# Patient Record
Sex: Female | Born: 1945 | Hispanic: No | Smoking: Never smoker
Health system: Southern US, Community
[De-identification: ages and names within clinical notes are randomized; demographics above are authoritative.]

## PROBLEM LIST (undated history)

## (undated) DIAGNOSIS — M1990 Unspecified osteoarthritis, unspecified site: Secondary | ICD-10-CM

## (undated) DIAGNOSIS — K449 Diaphragmatic hernia without obstruction or gangrene: Secondary | ICD-10-CM

## (undated) DIAGNOSIS — L309 Dermatitis, unspecified: Secondary | ICD-10-CM

## (undated) DIAGNOSIS — K219 Gastro-esophageal reflux disease without esophagitis: Secondary | ICD-10-CM

## (undated) HISTORY — DX: Diaphragmatic hernia without obstruction or gangrene: K44.9

## (undated) HISTORY — DX: Dermatitis, unspecified: L30.9

## (undated) HISTORY — DX: Unspecified osteoarthritis, unspecified site: M19.90

## (undated) HISTORY — DX: Gastro-esophageal reflux disease without esophagitis: K21.9

---

## 2000-02-25 ENCOUNTER — Other Ambulatory Visit: Admission: RE | Admit: 2000-02-25 | Discharge: 2000-02-25 | Payer: Self-pay | Admitting: Obstetrics and Gynecology

## 2001-03-05 ENCOUNTER — Other Ambulatory Visit: Admission: RE | Admit: 2001-03-05 | Discharge: 2001-03-05 | Payer: Self-pay | Admitting: Obstetrics and Gynecology

## 2002-03-14 ENCOUNTER — Other Ambulatory Visit: Admission: RE | Admit: 2002-03-14 | Discharge: 2002-03-14 | Payer: Self-pay | Admitting: Obstetrics and Gynecology

## 2003-03-17 ENCOUNTER — Other Ambulatory Visit: Admission: RE | Admit: 2003-03-17 | Discharge: 2003-03-17 | Payer: Self-pay | Admitting: Obstetrics and Gynecology

## 2011-10-26 DIAGNOSIS — H00039 Abscess of eyelid unspecified eye, unspecified eyelid: Secondary | ICD-10-CM | POA: Diagnosis not present

## 2011-11-02 DIAGNOSIS — Z1231 Encounter for screening mammogram for malignant neoplasm of breast: Secondary | ICD-10-CM | POA: Diagnosis not present

## 2011-11-02 DIAGNOSIS — M949 Disorder of cartilage, unspecified: Secondary | ICD-10-CM | POA: Diagnosis not present

## 2011-11-02 DIAGNOSIS — M899 Disorder of bone, unspecified: Secondary | ICD-10-CM | POA: Diagnosis not present

## 2011-11-17 DIAGNOSIS — H1044 Vernal conjunctivitis: Secondary | ICD-10-CM | POA: Diagnosis not present

## 2011-11-22 DIAGNOSIS — D649 Anemia, unspecified: Secondary | ICD-10-CM | POA: Diagnosis not present

## 2011-11-22 DIAGNOSIS — E785 Hyperlipidemia, unspecified: Secondary | ICD-10-CM | POA: Diagnosis not present

## 2011-11-22 DIAGNOSIS — Z0000 Encounter for general adult medical examination without abnormal findings: Secondary | ICD-10-CM | POA: Diagnosis not present

## 2011-11-22 DIAGNOSIS — Z23 Encounter for immunization: Secondary | ICD-10-CM | POA: Diagnosis not present

## 2011-11-22 DIAGNOSIS — E559 Vitamin D deficiency, unspecified: Secondary | ICD-10-CM | POA: Diagnosis not present

## 2011-11-22 DIAGNOSIS — R7301 Impaired fasting glucose: Secondary | ICD-10-CM | POA: Diagnosis not present

## 2011-11-22 DIAGNOSIS — M899 Disorder of bone, unspecified: Secondary | ICD-10-CM | POA: Diagnosis not present

## 2011-11-24 DIAGNOSIS — T7840XA Allergy, unspecified, initial encounter: Secondary | ICD-10-CM | POA: Diagnosis not present

## 2011-12-15 DIAGNOSIS — M899 Disorder of bone, unspecified: Secondary | ICD-10-CM | POA: Diagnosis not present

## 2011-12-15 DIAGNOSIS — E785 Hyperlipidemia, unspecified: Secondary | ICD-10-CM | POA: Diagnosis not present

## 2011-12-15 DIAGNOSIS — E559 Vitamin D deficiency, unspecified: Secondary | ICD-10-CM | POA: Diagnosis not present

## 2011-12-15 DIAGNOSIS — R7301 Impaired fasting glucose: Secondary | ICD-10-CM | POA: Diagnosis not present

## 2011-12-15 DIAGNOSIS — D649 Anemia, unspecified: Secondary | ICD-10-CM | POA: Diagnosis not present

## 2011-12-15 DIAGNOSIS — Z0000 Encounter for general adult medical examination without abnormal findings: Secondary | ICD-10-CM | POA: Diagnosis not present

## 2012-06-04 DIAGNOSIS — L2089 Other atopic dermatitis: Secondary | ICD-10-CM | POA: Diagnosis not present

## 2012-06-04 DIAGNOSIS — L821 Other seborrheic keratosis: Secondary | ICD-10-CM | POA: Diagnosis not present

## 2012-08-29 DIAGNOSIS — D2360 Other benign neoplasm of skin of unspecified upper limb, including shoulder: Secondary | ICD-10-CM | POA: Diagnosis not present

## 2012-08-29 DIAGNOSIS — L821 Other seborrheic keratosis: Secondary | ICD-10-CM | POA: Diagnosis not present

## 2012-08-29 DIAGNOSIS — D2370 Other benign neoplasm of skin of unspecified lower limb, including hip: Secondary | ICD-10-CM | POA: Diagnosis not present

## 2012-08-29 DIAGNOSIS — L819 Disorder of pigmentation, unspecified: Secondary | ICD-10-CM | POA: Diagnosis not present

## 2012-08-29 DIAGNOSIS — L2089 Other atopic dermatitis: Secondary | ICD-10-CM | POA: Diagnosis not present

## 2012-08-29 DIAGNOSIS — D485 Neoplasm of uncertain behavior of skin: Secondary | ICD-10-CM | POA: Diagnosis not present

## 2012-08-29 DIAGNOSIS — D239 Other benign neoplasm of skin, unspecified: Secondary | ICD-10-CM | POA: Diagnosis not present

## 2012-11-04 DIAGNOSIS — Z1231 Encounter for screening mammogram for malignant neoplasm of breast: Secondary | ICD-10-CM | POA: Diagnosis not present

## 2013-02-17 DIAGNOSIS — H109 Unspecified conjunctivitis: Secondary | ICD-10-CM | POA: Diagnosis not present

## 2013-03-04 DIAGNOSIS — H109 Unspecified conjunctivitis: Secondary | ICD-10-CM | POA: Diagnosis not present

## 2013-11-27 DIAGNOSIS — Z01419 Encounter for gynecological examination (general) (routine) without abnormal findings: Secondary | ICD-10-CM | POA: Diagnosis not present

## 2013-11-27 DIAGNOSIS — Z124 Encounter for screening for malignant neoplasm of cervix: Secondary | ICD-10-CM | POA: Diagnosis not present

## 2013-12-11 DIAGNOSIS — M899 Disorder of bone, unspecified: Secondary | ICD-10-CM | POA: Diagnosis not present

## 2013-12-11 DIAGNOSIS — M949 Disorder of cartilage, unspecified: Secondary | ICD-10-CM | POA: Diagnosis not present

## 2013-12-11 DIAGNOSIS — Z1231 Encounter for screening mammogram for malignant neoplasm of breast: Secondary | ICD-10-CM | POA: Diagnosis not present

## 2014-01-21 DIAGNOSIS — M25569 Pain in unspecified knee: Secondary | ICD-10-CM | POA: Diagnosis not present

## 2014-03-18 DIAGNOSIS — H1044 Vernal conjunctivitis: Secondary | ICD-10-CM | POA: Diagnosis not present

## 2014-04-03 DIAGNOSIS — H02409 Unspecified ptosis of unspecified eyelid: Secondary | ICD-10-CM | POA: Diagnosis not present

## 2014-04-23 DIAGNOSIS — M8580 Other specified disorders of bone density and structure, unspecified site: Secondary | ICD-10-CM | POA: Diagnosis not present

## 2014-04-23 DIAGNOSIS — E785 Hyperlipidemia, unspecified: Secondary | ICD-10-CM | POA: Diagnosis not present

## 2014-04-27 DIAGNOSIS — Z0000 Encounter for general adult medical examination without abnormal findings: Secondary | ICD-10-CM | POA: Diagnosis not present

## 2014-04-27 DIAGNOSIS — R319 Hematuria, unspecified: Secondary | ICD-10-CM | POA: Diagnosis not present

## 2014-04-27 DIAGNOSIS — R1032 Left lower quadrant pain: Secondary | ICD-10-CM | POA: Diagnosis not present

## 2014-04-27 DIAGNOSIS — M8580 Other specified disorders of bone density and structure, unspecified site: Secondary | ICD-10-CM | POA: Diagnosis not present

## 2014-04-27 DIAGNOSIS — E559 Vitamin D deficiency, unspecified: Secondary | ICD-10-CM | POA: Diagnosis not present

## 2014-04-27 DIAGNOSIS — E785 Hyperlipidemia, unspecified: Secondary | ICD-10-CM | POA: Diagnosis not present

## 2014-04-27 DIAGNOSIS — Z862 Personal history of diseases of the blood and blood-forming organs and certain disorders involving the immune mechanism: Secondary | ICD-10-CM | POA: Diagnosis not present

## 2014-07-15 DIAGNOSIS — L309 Dermatitis, unspecified: Secondary | ICD-10-CM | POA: Diagnosis not present

## 2014-07-15 DIAGNOSIS — R143 Flatulence: Secondary | ICD-10-CM | POA: Diagnosis not present

## 2015-01-13 DIAGNOSIS — Z1231 Encounter for screening mammogram for malignant neoplasm of breast: Secondary | ICD-10-CM | POA: Diagnosis not present

## 2015-05-18 DIAGNOSIS — L438 Other lichen planus: Secondary | ICD-10-CM | POA: Diagnosis not present

## 2015-05-18 DIAGNOSIS — L280 Lichen simplex chronicus: Secondary | ICD-10-CM | POA: Diagnosis not present

## 2015-05-18 DIAGNOSIS — L218 Other seborrheic dermatitis: Secondary | ICD-10-CM | POA: Diagnosis not present

## 2015-05-18 DIAGNOSIS — L570 Actinic keratosis: Secondary | ICD-10-CM | POA: Diagnosis not present

## 2015-05-18 DIAGNOSIS — L821 Other seborrheic keratosis: Secondary | ICD-10-CM | POA: Diagnosis not present

## 2015-07-15 DIAGNOSIS — D1801 Hemangioma of skin and subcutaneous tissue: Secondary | ICD-10-CM | POA: Diagnosis not present

## 2015-07-15 DIAGNOSIS — D225 Melanocytic nevi of trunk: Secondary | ICD-10-CM | POA: Diagnosis not present

## 2015-07-15 DIAGNOSIS — L821 Other seborrheic keratosis: Secondary | ICD-10-CM | POA: Diagnosis not present

## 2015-07-15 DIAGNOSIS — L218 Other seborrheic dermatitis: Secondary | ICD-10-CM | POA: Diagnosis not present

## 2015-07-15 DIAGNOSIS — D2372 Other benign neoplasm of skin of left lower limb, including hip: Secondary | ICD-10-CM | POA: Diagnosis not present

## 2015-07-15 DIAGNOSIS — L280 Lichen simplex chronicus: Secondary | ICD-10-CM | POA: Diagnosis not present

## 2015-10-29 DIAGNOSIS — H2513 Age-related nuclear cataract, bilateral: Secondary | ICD-10-CM | POA: Diagnosis not present

## 2016-02-07 DIAGNOSIS — Z1231 Encounter for screening mammogram for malignant neoplasm of breast: Secondary | ICD-10-CM | POA: Diagnosis not present

## 2016-02-07 DIAGNOSIS — M8589 Other specified disorders of bone density and structure, multiple sites: Secondary | ICD-10-CM | POA: Diagnosis not present

## 2016-02-08 DIAGNOSIS — Z124 Encounter for screening for malignant neoplasm of cervix: Secondary | ICD-10-CM | POA: Diagnosis not present

## 2016-03-16 DIAGNOSIS — Z136 Encounter for screening for cardiovascular disorders: Secondary | ICD-10-CM | POA: Diagnosis not present

## 2016-03-16 DIAGNOSIS — Z1159 Encounter for screening for other viral diseases: Secondary | ICD-10-CM | POA: Diagnosis not present

## 2016-03-16 DIAGNOSIS — Z131 Encounter for screening for diabetes mellitus: Secondary | ICD-10-CM | POA: Diagnosis not present

## 2016-03-16 DIAGNOSIS — Z0000 Encounter for general adult medical examination without abnormal findings: Secondary | ICD-10-CM | POA: Diagnosis not present

## 2016-03-21 DIAGNOSIS — Z0000 Encounter for general adult medical examination without abnormal findings: Secondary | ICD-10-CM | POA: Diagnosis not present

## 2016-03-21 DIAGNOSIS — M8580 Other specified disorders of bone density and structure, unspecified site: Secondary | ICD-10-CM | POA: Diagnosis not present

## 2016-03-21 DIAGNOSIS — M25562 Pain in left knee: Secondary | ICD-10-CM | POA: Diagnosis not present

## 2016-03-21 DIAGNOSIS — E559 Vitamin D deficiency, unspecified: Secondary | ICD-10-CM | POA: Diagnosis not present

## 2016-03-21 DIAGNOSIS — M25561 Pain in right knee: Secondary | ICD-10-CM | POA: Diagnosis not present

## 2016-03-21 DIAGNOSIS — Z1159 Encounter for screening for other viral diseases: Secondary | ICD-10-CM | POA: Diagnosis not present

## 2016-03-21 DIAGNOSIS — E785 Hyperlipidemia, unspecified: Secondary | ICD-10-CM | POA: Diagnosis not present

## 2016-03-23 DIAGNOSIS — Z1211 Encounter for screening for malignant neoplasm of colon: Secondary | ICD-10-CM | POA: Diagnosis not present

## 2016-05-22 DIAGNOSIS — H43811 Vitreous degeneration, right eye: Secondary | ICD-10-CM | POA: Diagnosis not present

## 2016-06-05 DIAGNOSIS — H43811 Vitreous degeneration, right eye: Secondary | ICD-10-CM | POA: Diagnosis not present

## 2016-07-05 DIAGNOSIS — E785 Hyperlipidemia, unspecified: Secondary | ICD-10-CM | POA: Diagnosis not present

## 2016-07-24 HISTORY — PX: BELPHAROPTOSIS REPAIR: SHX369

## 2016-07-24 HISTORY — PX: CATARACT EXTRACTION, BILATERAL: SHX1313

## 2016-08-02 DIAGNOSIS — L280 Lichen simplex chronicus: Secondary | ICD-10-CM | POA: Diagnosis not present

## 2016-08-02 DIAGNOSIS — D225 Melanocytic nevi of trunk: Secondary | ICD-10-CM | POA: Diagnosis not present

## 2016-08-02 DIAGNOSIS — L2089 Other atopic dermatitis: Secondary | ICD-10-CM | POA: Diagnosis not present

## 2016-08-02 DIAGNOSIS — L218 Other seborrheic dermatitis: Secondary | ICD-10-CM | POA: Diagnosis not present

## 2016-08-02 DIAGNOSIS — D1801 Hemangioma of skin and subcutaneous tissue: Secondary | ICD-10-CM | POA: Diagnosis not present

## 2016-08-02 DIAGNOSIS — L821 Other seborrheic keratosis: Secondary | ICD-10-CM | POA: Diagnosis not present

## 2016-11-28 DIAGNOSIS — H02831 Dermatochalasis of right upper eyelid: Secondary | ICD-10-CM | POA: Diagnosis not present

## 2016-11-28 DIAGNOSIS — H02834 Dermatochalasis of left upper eyelid: Secondary | ICD-10-CM | POA: Diagnosis not present

## 2017-02-07 DIAGNOSIS — Z1231 Encounter for screening mammogram for malignant neoplasm of breast: Secondary | ICD-10-CM | POA: Diagnosis not present

## 2017-02-25 DIAGNOSIS — B029 Zoster without complications: Secondary | ICD-10-CM | POA: Diagnosis not present

## 2017-03-14 DIAGNOSIS — Z124 Encounter for screening for malignant neoplasm of cervix: Secondary | ICD-10-CM | POA: Diagnosis not present

## 2017-03-14 DIAGNOSIS — Z8049 Family history of malignant neoplasm of other genital organs: Secondary | ICD-10-CM | POA: Diagnosis not present

## 2017-03-27 DIAGNOSIS — E785 Hyperlipidemia, unspecified: Secondary | ICD-10-CM | POA: Diagnosis not present

## 2017-03-29 ENCOUNTER — Other Ambulatory Visit: Payer: Self-pay | Admitting: Family Medicine

## 2017-03-29 DIAGNOSIS — Z862 Personal history of diseases of the blood and blood-forming organs and certain disorders involving the immune mechanism: Secondary | ICD-10-CM | POA: Diagnosis not present

## 2017-03-29 DIAGNOSIS — M8580 Other specified disorders of bone density and structure, unspecified site: Secondary | ICD-10-CM | POA: Diagnosis not present

## 2017-03-29 DIAGNOSIS — Z1389 Encounter for screening for other disorder: Secondary | ICD-10-CM | POA: Diagnosis not present

## 2017-03-29 DIAGNOSIS — R1310 Dysphagia, unspecified: Secondary | ICD-10-CM

## 2017-03-29 DIAGNOSIS — Z6825 Body mass index (BMI) 25.0-25.9, adult: Secondary | ICD-10-CM | POA: Diagnosis not present

## 2017-03-29 DIAGNOSIS — Z0000 Encounter for general adult medical examination without abnormal findings: Secondary | ICD-10-CM | POA: Diagnosis not present

## 2017-03-29 DIAGNOSIS — R5383 Other fatigue: Secondary | ICD-10-CM | POA: Diagnosis not present

## 2017-03-29 DIAGNOSIS — E785 Hyperlipidemia, unspecified: Secondary | ICD-10-CM | POA: Diagnosis not present

## 2017-03-29 DIAGNOSIS — E559 Vitamin D deficiency, unspecified: Secondary | ICD-10-CM | POA: Diagnosis not present

## 2017-03-29 DIAGNOSIS — E663 Overweight: Secondary | ICD-10-CM | POA: Diagnosis not present

## 2017-04-04 ENCOUNTER — Ambulatory Visit
Admission: RE | Admit: 2017-04-04 | Discharge: 2017-04-04 | Disposition: A | Discharge status: Home / Self Care | Payer: Medicare Other | Source: Ambulatory Visit | Attending: Family Medicine | Admitting: Family Medicine

## 2017-04-04 ENCOUNTER — Other Ambulatory Visit: Payer: Self-pay

## 2017-04-04 DIAGNOSIS — R1310 Dysphagia, unspecified: Secondary | ICD-10-CM

## 2017-04-04 DIAGNOSIS — K449 Diaphragmatic hernia without obstruction or gangrene: Secondary | ICD-10-CM | POA: Diagnosis not present

## 2017-04-18 DIAGNOSIS — H02413 Mechanical ptosis of bilateral eyelids: Secondary | ICD-10-CM | POA: Diagnosis not present

## 2017-05-23 DIAGNOSIS — H02413 Mechanical ptosis of bilateral eyelids: Secondary | ICD-10-CM | POA: Diagnosis not present

## 2017-07-04 DIAGNOSIS — R1310 Dysphagia, unspecified: Secondary | ICD-10-CM | POA: Diagnosis not present

## 2017-07-04 DIAGNOSIS — Z23 Encounter for immunization: Secondary | ICD-10-CM | POA: Diagnosis not present

## 2017-07-04 DIAGNOSIS — E663 Overweight: Secondary | ICD-10-CM | POA: Diagnosis not present

## 2017-07-04 DIAGNOSIS — E785 Hyperlipidemia, unspecified: Secondary | ICD-10-CM | POA: Diagnosis not present

## 2017-08-07 DIAGNOSIS — D225 Melanocytic nevi of trunk: Secondary | ICD-10-CM | POA: Diagnosis not present

## 2017-08-07 DIAGNOSIS — L280 Lichen simplex chronicus: Secondary | ICD-10-CM | POA: Diagnosis not present

## 2017-08-07 DIAGNOSIS — D2372 Other benign neoplasm of skin of left lower limb, including hip: Secondary | ICD-10-CM | POA: Diagnosis not present

## 2017-08-07 DIAGNOSIS — D224 Melanocytic nevi of scalp and neck: Secondary | ICD-10-CM | POA: Diagnosis not present

## 2017-08-07 DIAGNOSIS — L720 Epidermal cyst: Secondary | ICD-10-CM | POA: Diagnosis not present

## 2017-08-07 DIAGNOSIS — L218 Other seborrheic dermatitis: Secondary | ICD-10-CM | POA: Diagnosis not present

## 2017-08-07 DIAGNOSIS — L918 Other hypertrophic disorders of the skin: Secondary | ICD-10-CM | POA: Diagnosis not present

## 2017-08-07 DIAGNOSIS — L821 Other seborrheic keratosis: Secondary | ICD-10-CM | POA: Diagnosis not present

## 2017-09-28 DIAGNOSIS — J111 Influenza due to unidentified influenza virus with other respiratory manifestations: Secondary | ICD-10-CM | POA: Diagnosis not present

## 2018-01-11 DIAGNOSIS — H2513 Age-related nuclear cataract, bilateral: Secondary | ICD-10-CM | POA: Diagnosis not present

## 2018-02-11 DIAGNOSIS — Z1231 Encounter for screening mammogram for malignant neoplasm of breast: Secondary | ICD-10-CM | POA: Diagnosis not present

## 2018-02-11 DIAGNOSIS — M8589 Other specified disorders of bone density and structure, multiple sites: Secondary | ICD-10-CM | POA: Diagnosis not present

## 2018-03-13 DIAGNOSIS — H2513 Age-related nuclear cataract, bilateral: Secondary | ICD-10-CM | POA: Diagnosis not present

## 2018-03-18 DIAGNOSIS — Z124 Encounter for screening for malignant neoplasm of cervix: Secondary | ICD-10-CM | POA: Diagnosis not present

## 2018-03-24 DIAGNOSIS — M791 Myalgia, unspecified site: Secondary | ICD-10-CM | POA: Diagnosis not present

## 2018-03-24 DIAGNOSIS — R509 Fever, unspecified: Secondary | ICD-10-CM | POA: Diagnosis not present

## 2018-04-01 DIAGNOSIS — D259 Leiomyoma of uterus, unspecified: Secondary | ICD-10-CM | POA: Diagnosis not present

## 2018-04-01 DIAGNOSIS — Z8049 Family history of malignant neoplasm of other genital organs: Secondary | ICD-10-CM | POA: Diagnosis not present

## 2018-04-03 DIAGNOSIS — G5701 Lesion of sciatic nerve, right lower limb: Secondary | ICD-10-CM | POA: Diagnosis not present

## 2018-04-03 DIAGNOSIS — Z6825 Body mass index (BMI) 25.0-25.9, adult: Secondary | ICD-10-CM | POA: Diagnosis not present

## 2018-04-04 DIAGNOSIS — H2513 Age-related nuclear cataract, bilateral: Secondary | ICD-10-CM | POA: Diagnosis not present

## 2018-04-12 DIAGNOSIS — Z23 Encounter for immunization: Secondary | ICD-10-CM | POA: Diagnosis not present

## 2018-04-12 DIAGNOSIS — Z79899 Other long term (current) drug therapy: Secondary | ICD-10-CM | POA: Diagnosis not present

## 2018-04-12 DIAGNOSIS — Z6825 Body mass index (BMI) 25.0-25.9, adult: Secondary | ICD-10-CM | POA: Diagnosis not present

## 2018-04-12 DIAGNOSIS — Z1389 Encounter for screening for other disorder: Secondary | ICD-10-CM | POA: Diagnosis not present

## 2018-04-12 DIAGNOSIS — E785 Hyperlipidemia, unspecified: Secondary | ICD-10-CM | POA: Diagnosis not present

## 2018-04-12 DIAGNOSIS — M25562 Pain in left knee: Secondary | ICD-10-CM | POA: Diagnosis not present

## 2018-04-12 DIAGNOSIS — Z0000 Encounter for general adult medical examination without abnormal findings: Secondary | ICD-10-CM | POA: Diagnosis not present

## 2018-04-12 DIAGNOSIS — M25561 Pain in right knee: Secondary | ICD-10-CM | POA: Diagnosis not present

## 2018-04-25 DIAGNOSIS — H524 Presbyopia: Secondary | ICD-10-CM | POA: Diagnosis not present

## 2018-04-25 DIAGNOSIS — H25013 Cortical age-related cataract, bilateral: Secondary | ICD-10-CM | POA: Diagnosis not present

## 2018-04-25 DIAGNOSIS — H2513 Age-related nuclear cataract, bilateral: Secondary | ICD-10-CM | POA: Diagnosis not present

## 2018-05-21 DIAGNOSIS — H25811 Combined forms of age-related cataract, right eye: Secondary | ICD-10-CM | POA: Diagnosis not present

## 2018-05-21 DIAGNOSIS — H25011 Cortical age-related cataract, right eye: Secondary | ICD-10-CM | POA: Diagnosis not present

## 2018-05-21 DIAGNOSIS — H2511 Age-related nuclear cataract, right eye: Secondary | ICD-10-CM | POA: Diagnosis not present

## 2018-06-11 DIAGNOSIS — H25812 Combined forms of age-related cataract, left eye: Secondary | ICD-10-CM | POA: Diagnosis not present

## 2018-06-11 DIAGNOSIS — H25012 Cortical age-related cataract, left eye: Secondary | ICD-10-CM | POA: Diagnosis not present

## 2018-06-11 DIAGNOSIS — H2512 Age-related nuclear cataract, left eye: Secondary | ICD-10-CM | POA: Diagnosis not present

## 2018-08-13 DIAGNOSIS — L821 Other seborrheic keratosis: Secondary | ICD-10-CM | POA: Diagnosis not present

## 2018-08-13 DIAGNOSIS — L280 Lichen simplex chronicus: Secondary | ICD-10-CM | POA: Diagnosis not present

## 2018-08-13 DIAGNOSIS — D1801 Hemangioma of skin and subcutaneous tissue: Secondary | ICD-10-CM | POA: Diagnosis not present

## 2018-08-13 DIAGNOSIS — L853 Xerosis cutis: Secondary | ICD-10-CM | POA: Diagnosis not present

## 2018-08-26 DIAGNOSIS — H1013 Acute atopic conjunctivitis, bilateral: Secondary | ICD-10-CM | POA: Diagnosis not present

## 2019-01-18 DIAGNOSIS — B029 Zoster without complications: Secondary | ICD-10-CM | POA: Diagnosis not present

## 2019-02-17 DIAGNOSIS — Z1231 Encounter for screening mammogram for malignant neoplasm of breast: Secondary | ICD-10-CM | POA: Diagnosis not present

## 2019-03-26 DIAGNOSIS — E785 Hyperlipidemia, unspecified: Secondary | ICD-10-CM | POA: Diagnosis not present

## 2019-03-26 DIAGNOSIS — M8580 Other specified disorders of bone density and structure, unspecified site: Secondary | ICD-10-CM | POA: Diagnosis not present

## 2019-04-29 DIAGNOSIS — Z23 Encounter for immunization: Secondary | ICD-10-CM | POA: Diagnosis not present

## 2019-04-29 DIAGNOSIS — Z6826 Body mass index (BMI) 26.0-26.9, adult: Secondary | ICD-10-CM | POA: Diagnosis not present

## 2019-04-29 DIAGNOSIS — H6123 Impacted cerumen, bilateral: Secondary | ICD-10-CM | POA: Diagnosis not present

## 2019-04-29 DIAGNOSIS — E559 Vitamin D deficiency, unspecified: Secondary | ICD-10-CM | POA: Diagnosis not present

## 2019-04-29 DIAGNOSIS — J309 Allergic rhinitis, unspecified: Secondary | ICD-10-CM | POA: Diagnosis not present

## 2019-04-29 DIAGNOSIS — E785 Hyperlipidemia, unspecified: Secondary | ICD-10-CM | POA: Diagnosis not present

## 2019-04-29 DIAGNOSIS — M859 Disorder of bone density and structure, unspecified: Secondary | ICD-10-CM | POA: Diagnosis not present

## 2019-04-29 DIAGNOSIS — Z79899 Other long term (current) drug therapy: Secondary | ICD-10-CM | POA: Diagnosis not present

## 2019-04-29 DIAGNOSIS — Z0000 Encounter for general adult medical examination without abnormal findings: Secondary | ICD-10-CM | POA: Diagnosis not present

## 2019-04-29 DIAGNOSIS — E663 Overweight: Secondary | ICD-10-CM | POA: Diagnosis not present

## 2019-05-15 DIAGNOSIS — Z124 Encounter for screening for malignant neoplasm of cervix: Secondary | ICD-10-CM | POA: Diagnosis not present

## 2019-06-02 DIAGNOSIS — E785 Hyperlipidemia, unspecified: Secondary | ICD-10-CM | POA: Diagnosis not present

## 2019-06-02 DIAGNOSIS — M8580 Other specified disorders of bone density and structure, unspecified site: Secondary | ICD-10-CM | POA: Diagnosis not present

## 2019-08-15 DIAGNOSIS — M8580 Other specified disorders of bone density and structure, unspecified site: Secondary | ICD-10-CM | POA: Diagnosis not present

## 2019-08-15 DIAGNOSIS — E785 Hyperlipidemia, unspecified: Secondary | ICD-10-CM | POA: Diagnosis not present

## 2019-08-19 DIAGNOSIS — D1801 Hemangioma of skin and subcutaneous tissue: Secondary | ICD-10-CM | POA: Diagnosis not present

## 2019-08-19 DIAGNOSIS — L821 Other seborrheic keratosis: Secondary | ICD-10-CM | POA: Diagnosis not present

## 2019-08-19 DIAGNOSIS — D225 Melanocytic nevi of trunk: Secondary | ICD-10-CM | POA: Diagnosis not present

## 2019-08-19 DIAGNOSIS — L918 Other hypertrophic disorders of the skin: Secondary | ICD-10-CM | POA: Diagnosis not present

## 2019-08-19 DIAGNOSIS — L309 Dermatitis, unspecified: Secondary | ICD-10-CM | POA: Diagnosis not present

## 2019-08-19 DIAGNOSIS — D2372 Other benign neoplasm of skin of left lower limb, including hip: Secondary | ICD-10-CM | POA: Diagnosis not present

## 2019-08-19 DIAGNOSIS — L603 Nail dystrophy: Secondary | ICD-10-CM | POA: Diagnosis not present

## 2019-08-26 IMAGING — RF DG UGI W/ HIGH DENSITY W/KUB
7 series · 14 of 24 positions shown · non-contrast
Comparison: None.

CLINICAL DATA: Dysphasia.

EXAM:
UPPER GI SERIES WITH KUB
TECHNIQUE: After obtaining a scout radiograph a routine upper GI series was
performed using thin and high density barium.
FLUOROSCOPY TIME:  Fluoroscopy Time:  1 minutes and 54 seconds
Radiation Exposure Index (if provided by the fluoroscopic device):
82 mGy
Number of Acquired Spot Images: 0

[Series 1: one shot · 1 of 1 slices shown (1 of 2)]
[im 1/1]
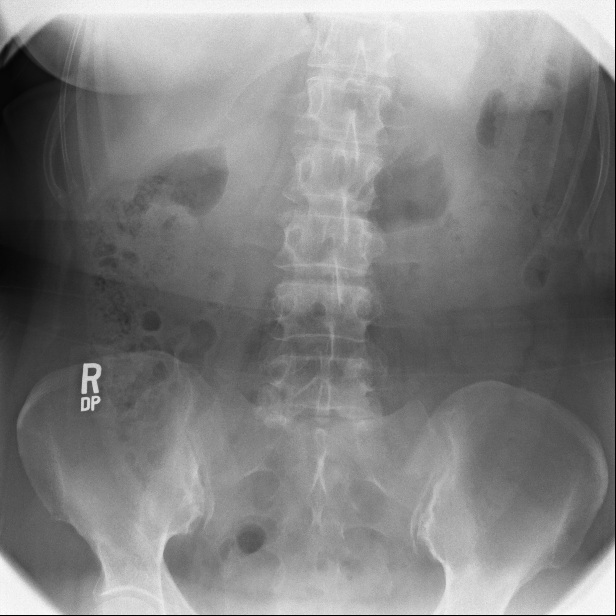

[Series 2: sequence · 1 of 11 frames shown (1 of 5)]
[frame 6/11]
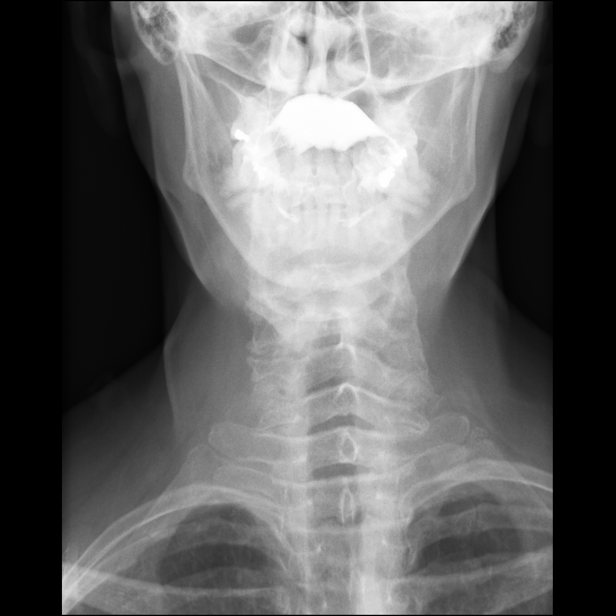

[Series 3: sequence · 3 of 9 frames shown (2 of 5)]
[frame 2/9]
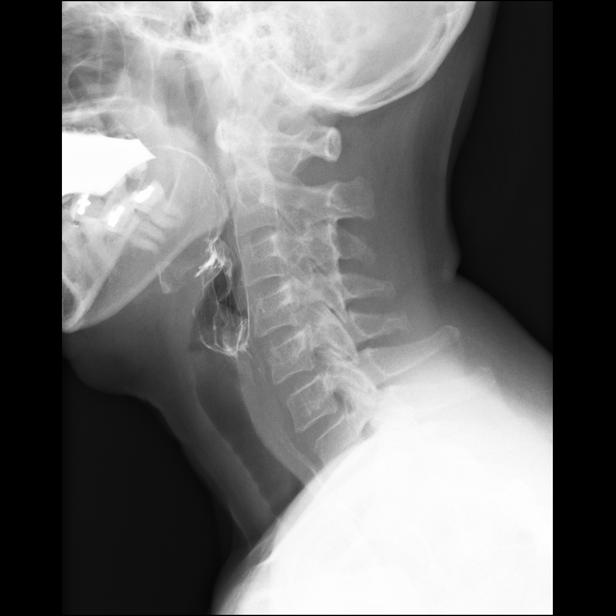
[frame 5/9]
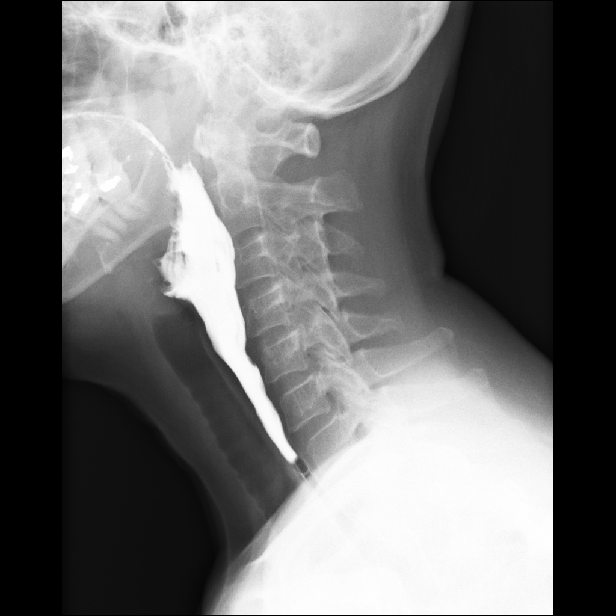
[frame 8/9]
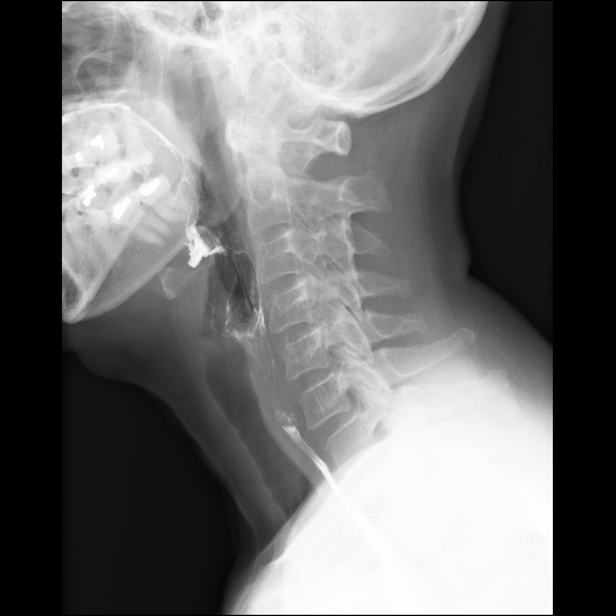

[Series 4: sequence · 1 of 20 frames shown (3 of 5)]
[frame 11/20]
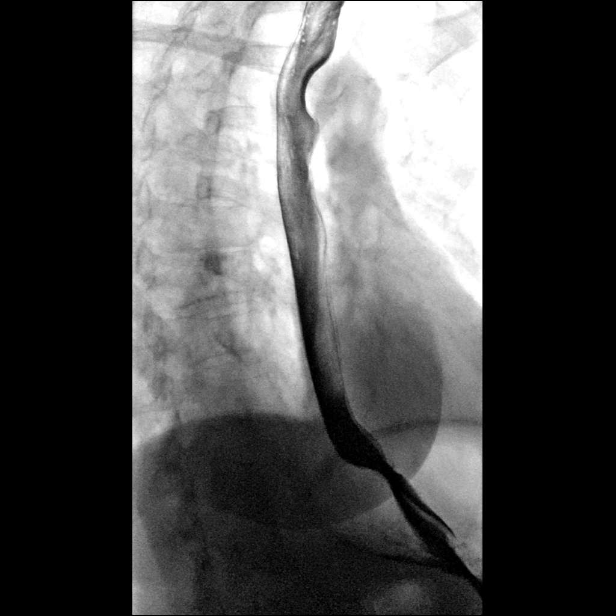

[Series 5: sequence · 3 of 24 frames shown (4 of 5)]
[frame 4/24]
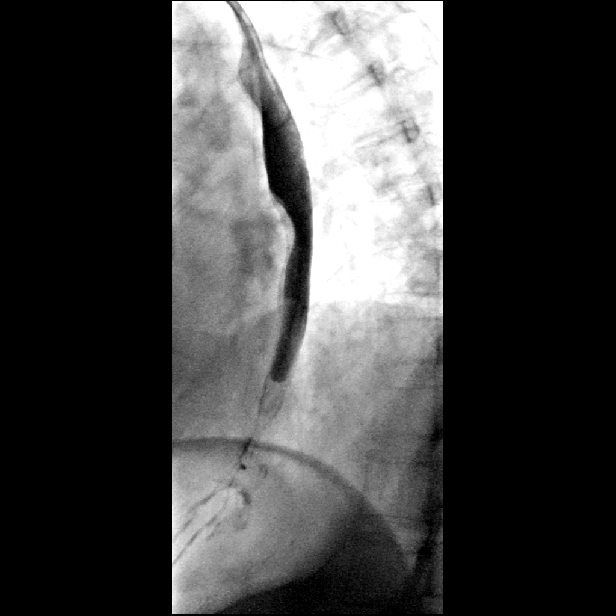
[frame 13/24]
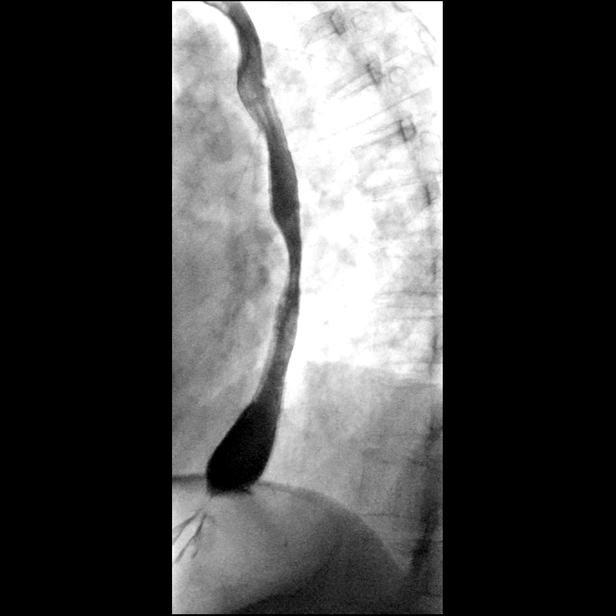
[frame 22/24]
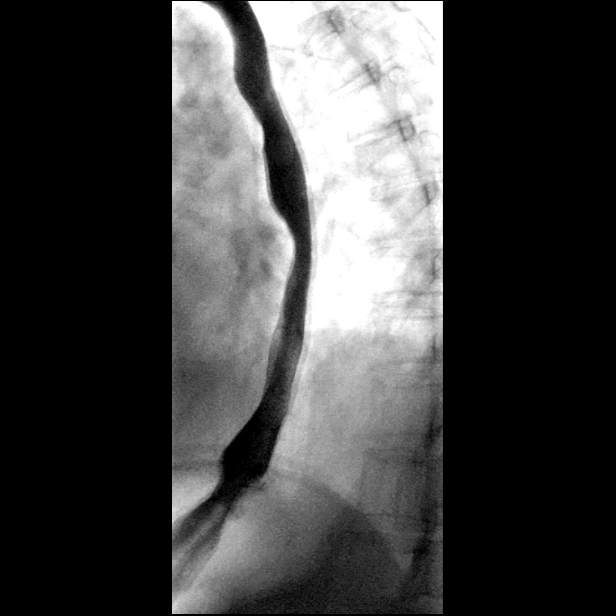

[Series 7: sequence · 2 of 13 frames shown (5 of 5)]
[frame 6/13]
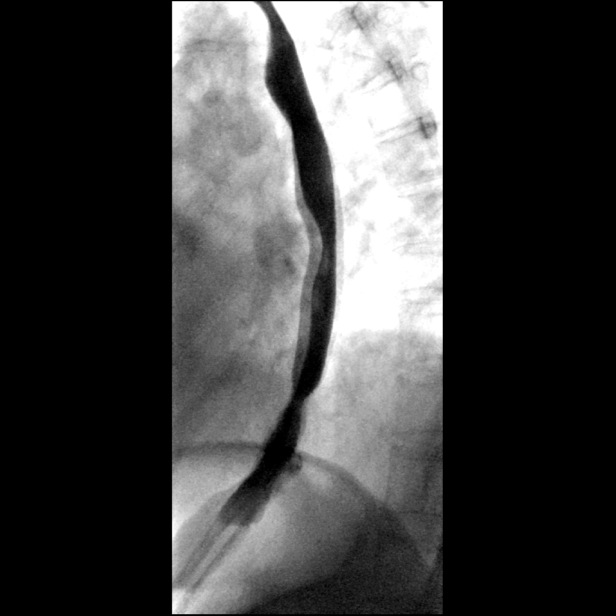
[frame 12/13]
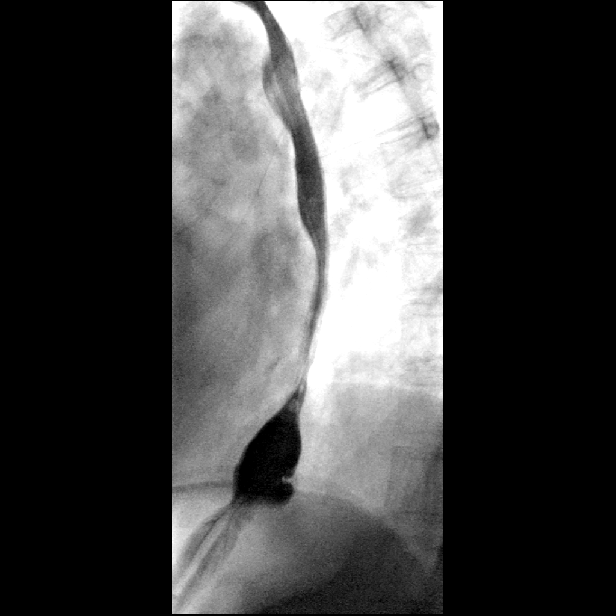

[Series 8: one shot · 3 of 6 slices shown (2 of 2)]
[im 1/6]
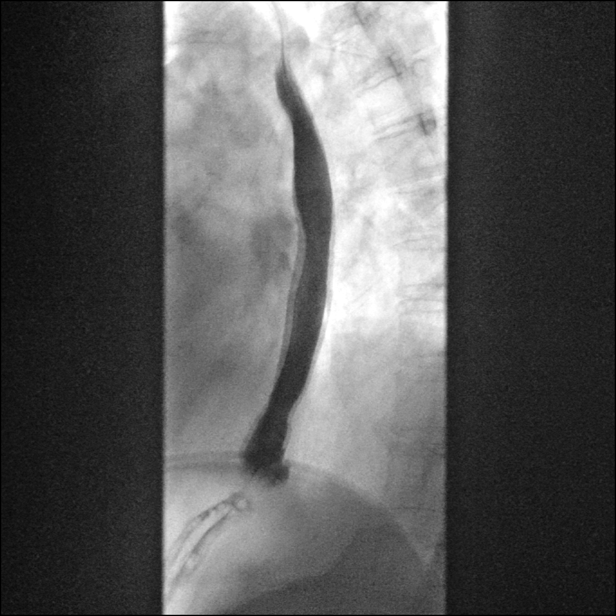
[im 4/6]
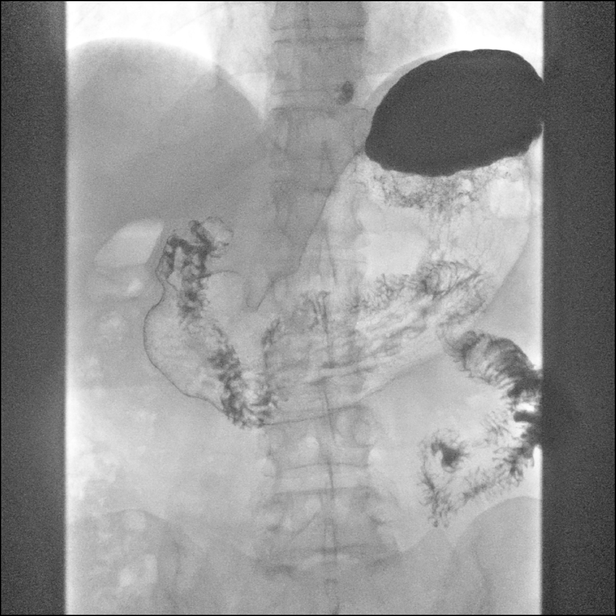
[im 6/6]
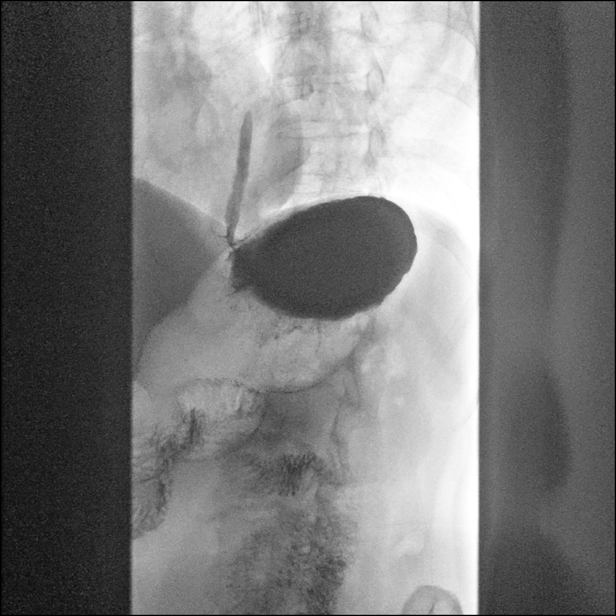

[14 of 24 positions shown; findings below may reference images not displayed]

FINDINGS: Initial barium swallows demonstrate normal pharyngeal motion with
swallowing. No laryngeal penetration or aspiration. No upper
esophageal webs, strictures or diverticuli.

Normal esophageal motility. No intrinsic or extrinsic lesions of the
esophagus were identified. The was a very small sliding-type hiatal
hernia and minimal GE reflux with water swallowing. No stricture or
mass. The 13 mm barium pill passed into the stomach without
difficulty.

The stomach, duodenum bulb and C-loop appear normal. The duodenum
jejunal junction is in its normal anatomic location.
IMPRESSION: 1. Very small sliding-type hiatal hernia with minimal inducible GE
reflux.
2. No esophageal mass or stricture.
3. Normal appearance of the stomach and duodenum.

## 2019-09-21 DIAGNOSIS — Z20828 Contact with and (suspected) exposure to other viral communicable diseases: Secondary | ICD-10-CM | POA: Diagnosis not present

## 2019-10-10 DIAGNOSIS — M25561 Pain in right knee: Secondary | ICD-10-CM | POA: Diagnosis not present

## 2019-10-10 DIAGNOSIS — M1711 Unilateral primary osteoarthritis, right knee: Secondary | ICD-10-CM | POA: Diagnosis not present

## 2019-10-20 DIAGNOSIS — H5212 Myopia, left eye: Secondary | ICD-10-CM | POA: Diagnosis not present

## 2019-10-20 DIAGNOSIS — Z961 Presence of intraocular lens: Secondary | ICD-10-CM | POA: Diagnosis not present

## 2019-10-20 DIAGNOSIS — H26493 Other secondary cataract, bilateral: Secondary | ICD-10-CM | POA: Diagnosis not present

## 2019-10-20 DIAGNOSIS — H43813 Vitreous degeneration, bilateral: Secondary | ICD-10-CM | POA: Diagnosis not present

## 2019-10-24 DIAGNOSIS — M25561 Pain in right knee: Secondary | ICD-10-CM | POA: Diagnosis not present

## 2019-10-24 DIAGNOSIS — M7061 Trochanteric bursitis, right hip: Secondary | ICD-10-CM | POA: Diagnosis not present

## 2019-10-24 DIAGNOSIS — M25562 Pain in left knee: Secondary | ICD-10-CM | POA: Diagnosis not present

## 2019-11-21 DIAGNOSIS — M1711 Unilateral primary osteoarthritis, right knee: Secondary | ICD-10-CM | POA: Diagnosis not present

## 2019-12-04 DIAGNOSIS — E785 Hyperlipidemia, unspecified: Secondary | ICD-10-CM | POA: Diagnosis not present

## 2019-12-04 DIAGNOSIS — M1711 Unilateral primary osteoarthritis, right knee: Secondary | ICD-10-CM | POA: Diagnosis not present

## 2019-12-04 DIAGNOSIS — M8580 Other specified disorders of bone density and structure, unspecified site: Secondary | ICD-10-CM | POA: Diagnosis not present

## 2019-12-16 DIAGNOSIS — M25561 Pain in right knee: Secondary | ICD-10-CM | POA: Diagnosis not present

## 2019-12-16 DIAGNOSIS — M1711 Unilateral primary osteoarthritis, right knee: Secondary | ICD-10-CM | POA: Diagnosis not present

## 2019-12-19 ENCOUNTER — Encounter: Payer: Self-pay | Admitting: Allergy & Immunology

## 2019-12-19 ENCOUNTER — Ambulatory Visit (INDEPENDENT_AMBULATORY_CARE_PROVIDER_SITE_OTHER): Payer: Medicare Other | Admitting: Allergy & Immunology

## 2019-12-19 ENCOUNTER — Other Ambulatory Visit: Payer: Self-pay

## 2019-12-19 DIAGNOSIS — J310 Chronic rhinitis: Secondary | ICD-10-CM | POA: Insufficient documentation

## 2019-12-19 DIAGNOSIS — M1711 Unilateral primary osteoarthritis, right knee: Secondary | ICD-10-CM

## 2019-12-19 DIAGNOSIS — E785 Hyperlipidemia, unspecified: Secondary | ICD-10-CM | POA: Insufficient documentation

## 2019-12-19 DIAGNOSIS — L230 Allergic contact dermatitis due to metals: Secondary | ICD-10-CM

## 2019-12-19 NOTE — Progress Notes (Signed)
RE: Tina Richards MRN: [id number] DOB: Sep 22, 1945 Date of Telemedicine Visit: 12/19/2019   Referring provider: Kathyrn Lass, MD Primary care provider: Kathyrn Lass, MD  Chief Complaint: Allergic Reaction Tina Richards several years ago/ Total knee replacement 01/06/2020)   Telemedicine Follow Up Visit via Telephone: I connected with Tina Richards for a follow up on 12/19/19 by telephone and verified that I am speaking with the correct person using two identifiers.   I discussed the limitations, risks, security and privacy concerns of performing an evaluation and management service by telephone and the availability of in person appointments. I also discussed with the patient that there may be a patient responsible charge related to this service. The patient expressed understanding and agreed to proceed.  Patient is at home.  Provider is at the office.  Visit start time: 10:33 AM Visit end time: 10:59 AM Insurance consent/check in by: Tina Richards consent and medical assistant/nurse: Tina Richards  History of Present Illness:  She is a 74 y.o. female, who presents today for evaluation of a possible metal allergy.  She is having surgery on June 15th. She is having a knee replacement on the right side. She is being told that it will four weeks before she drives and she is slightly nervous about the procedure. The surgery is going to be performed by [facility] at [facility].   She does have a history of metal sensitivity. She had her ears pierced in college and she worse cheap earrings and they never healed. After that, she switched to hold and her ears healed. Cheaper ear rings always caused irritation. She also and a necklace and ring that irritated her. She has figured out what to wear and she can tolerate gold and silver.    She is also sensitive to cleaning products and fragrances. But this more sneezing and airway irritation. She does not have skin irritation. She never uses anything scented  like hand lotions since that bothers her hands.   She talked to them yesterday and there is nickel in the device. This is the one that the surgeon likes to use. They have a titanium back up in case she is positive to nickel. Therefore getting the patch testing performed is of paramount importance.   She does have environmental allergies that is controlled with Allegra daily. She takes this throughout the year. She does use saline but no other nose sprays. She has not had prednisone in a couple of weeks.  Otherwise, there have been no changes to her past medical history, surgical history, family history, or social history.  Patient Active Problem List   Diagnosis Date Noted  . Chronic rhinitis 12/19/2019  . Osteoarthritis of right knee 12/19/2019  . 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 Hyperlipidemia 12/19/2019    Past Medical History:  Diagnosis Date  . Eczema   . GERD (gastroesophageal reflux disease)   . Hiatal hernia   . Osteoarthritis    Past Surgical History:  Procedure Laterality Date  . Tina [facility]  2018  . CATARACT EXTRACTION, BILATERAL  2018   Social History: She is retired. There is no smoking and no dog in the home.   Assessment and Plan:  Tina Richards is a 74 y.o. female with:  Allergic contact dermatitis due to metals  Allergic contact dermatitis due to fragrances  Chronic rhinitis  Osteoarthritis of right knee   Tina Richards presents for an evaluation of possible contact dermatitis.  The most important thing in her history, given  the upcoming orthopedic surgery, is her possible metal sensitivity.  However, she also has frequent sensitivities.  Therefore, we will place both the metal series and the True Test when she follows up on June 7 for patch placement.  I did discuss the placement process as well as the need to read the test on a Wednesday and Friday.  We will have all of the results just in time for her surgery on Tuesday, June 15.   We are to continue with the Allegra for her environmental allergies.  She never needs any additional medications aside from nasal saline, so I do not think that prick testing for environmental allergies is indicated at this time.  We can certainly revisit that in the future after she has recovered from her knee surgery.  Patient is in agreement with the plan.    Diagnostics: None.  Medication List:  Current Outpatient Medications  Medication Sig Dispense Refill  . calcium citrate-vitamin D (CITRACAL+D) 315-200 MG-UNIT tablet Take by mouth.    . fexofenadine (ALLEGRA) 180 MG tablet Take 1 tablet by mouth daily.    Marland Kitchen ketotifen (ZADITOR) 0.025 % ophthalmic solution Apply to eye.    . Loteprednol Etabonate 0.5 % OINT Apply to eye.    . meloxicam (MOBIC) 15 MG tablet Take 15 mg by mouth daily.    . Multiple Vitamins-Minerals (MULTIVITAMIN WOMEN 50+ PO) Take by mouth.    Marland Kitchen Specialty Vitamins Products (COLLAGEN ULTRA PO) Take by mouth.     No current facility-administered medications for this visit.   Allergies: Allergies  Allergen Reactions  . Atorvastatin Other (See Comments)  . Codeine Nausea Only   I reviewed her past medical history, social history, family history, and environmental history and no significant changes have been reported from previous visits.  Review of Systems  Constitutional: Negative for activity change, appetite change, chills, fatigue and fever.  HENT: Positive for postnasal drip and rhinorrhea. Negative for congestion, sinus pressure and sore throat.   Eyes: Negative for pain, discharge, redness and itching.  Respiratory: Negative for shortness of breath, wheezing and stridor.   Gastrointestinal: Negative for diarrhea, nausea and vomiting.  Endocrine: Negative for cold intolerance and heat intolerance.  Musculoskeletal: Negative for arthralgias, joint swelling and myalgias.  Skin: Negative for rash.  Allergic/Immunologic: Positive for environmental allergies.  Negative for food allergies and immunocompromised state.    Objective:  Physical exam not obtained as encounter was done via telephone.   Previous notes and tests were reviewed.  I discussed the assessment and treatment plan with the patient. The patient was provided an opportunity to ask questions and all were answered. The patient agreed with the plan and demonstrated an understanding of the instructions.   The patient was advised to call back or seek an in-person evaluation if the symptoms worsen or if the condition fails to improve as anticipated.  I provided 26 minutes of non-face-to-face time during this encounter.  It was my pleasure to participate in [place] care today. Please feel free to contact me with any questions or concerns.   Sincerely,  Valentina Shaggy, MD

## 2019-12-28 DIAGNOSIS — L239 Allergic contact dermatitis, unspecified cause: Secondary | ICD-10-CM | POA: Insufficient documentation

## 2019-12-28 NOTE — Progress Notes (Signed)
   Follow Up Note  RE: Tina Richards MRN: [id number] DOB: 12-Jun-1946 Date of Office Visit: 12/29/2019  Referring provider: Kathyrn Lass, MD Primary care provider: Kathyrn Lass, MD  History of Present Illness: I had the pleasure of seeing Tina Richards for a follow up visit at the [facility] [place] [facility] [place] on 12/29/2019. She is a 74 y.o. female, who is being followed for possible dermatitis. Today she is here for patch test placement, given suspected history of contact dermatitis.   She is having surgery on June 15th. She is having a knee replacement on the right side. The surgery is going to be performed by Dr. Wynelle Link at [facility].  She does have a history of metal sensitivity. She had her ears pierced in college and she worse cheap earrings and they never healed. After that, she switched to gold and her ears healed. She can tolerate gold and silver.    Diagnostics: TRUE Test and metal patches placed.   Assessment and Plan: Tina Richards is a 74 y.o. female with: Allergic contact dermatitis History of sensitivity to Nickel. Okay with gold and silver. Planning on right knee replacement on 6/15.  TRUE and metal patch test placed today.    The patient was instructed regarding proper care of the patches for the next 48 hours. Do not get patches wet - avoid showering until the next visit. Do not engage in vigorous physical activity.  Patient will follow up in 48 hours and 96 hours for patch readings.  It was my pleasure to see Tina Richards today and participate in her care. Please feel free to contact me with any questions or concerns.  Sincerely,  Rexene Alberts, DO Allergy & Immunology  [facility] office: [phone] [facility] office: [place] office: [phone]

## 2019-12-29 ENCOUNTER — Ambulatory Visit (INDEPENDENT_AMBULATORY_CARE_PROVIDER_SITE_OTHER): Payer: Medicare Other | Admitting: Allergy

## 2019-12-29 ENCOUNTER — Other Ambulatory Visit: Payer: Self-pay

## 2019-12-29 ENCOUNTER — Encounter: Payer: Self-pay | Admitting: Allergy

## 2019-12-29 VITALS — BP 136/76 | HR 90 | Temp 97.6°F | Resp 16 | Ht 63.5 in | Wt 163.0 lb

## 2019-12-29 DIAGNOSIS — L239 Allergic contact dermatitis, unspecified cause: Secondary | ICD-10-CM

## 2019-12-29 NOTE — Patient Instructions (Signed)
.   Patches placed today. . Please avoid strenuous physical activities and do not get the patches on the back wet. No showering until final patch reading done. Tina Richards to take antihistamines for itching but avoid placing any creams on the back where the patches are. . We will remove the patches on Wednesday and will do our initial read. . Then you will come back on Friday for a final read.

## 2019-12-29 NOTE — Assessment & Plan Note (Signed)
History of sensitivity to Nickel. Okay with gold and silver. Planning on right knee replacement on 6/15.  TRUE and metal patch test placed today.

## 2019-12-30 NOTE — Progress Notes (Signed)
Follow Up Note  RE: CLEA DUBACH MRN: [id number] DOB: 1946/05/07 Date of Office Visit: 12/31/2019  Referring provider: Kathyrn Lass, MD Primary care provider: Kathyrn Lass, MD  History of Present Illness: I had the pleasure of seeing Tina Richards for a follow up visit at the [facility] [place] [facility] [place] on 12/31/2019. She is a 74 y.o. female, who is being followed for dermatitis. Today she is here for initial patch test interpretation, given suspected history of contact dermatitis.   Diagnostics:  TRUE TEST and metal test 48 hour reading:  Borderline to Nickel and positive to gold.  T.R.U.E. Test - 12/31/19 1300    Time Antigen Placed  [place]  Other   [employer] [place]   Lot #  [id number]    Location  Back    Number of Test  36    Reading Interval  Day 1    Panel  Panel 1;Panel 2;Panel 3    1. Nickel Sulfate  --   +/-   2. Wool Alcohols  0    3. Neomycin Sulfate  0    4. Potassium Dichromate  0    5. Caine Mix  0    6. Fragrance Mix  0    7. Colophony  0    8. Paraben Mix  0    9. Negative Control  0    10. Balsam of [place]  0    11. Ethylenediamine Dihydrochloride  0    12. Cobalt Dichloride  0    13. p-tert Butylphenol Formaldehyde Resin  0    14. Epoxy Resin  0    15. Carba Mix  0    16.  Black Rubber Mix  0    17. Cl+ Me-Isothiazolinone  0    18. Quaternium-15  0    19. Methyldibromo Glutaronitrile  0    20. p-Phenylenediamine  0    21. Formaldehyde  0    22. Mercapto Mix  0    23. Thimerosal  0    24. Thiuram Mix  0    25. Diazolidinyl Urea  0    26. Quinoline Mix  0    27. Tixocortol-21-Pivalate  0    28. Gold Sodium Thiosulfate  2    29. Imidazolidinyl Urea  0    30. Budesonide  0    31. Hydrocortisone-17-Butyrate  0    32. Mercaptobenzothiazole  0    33. Bacitracin  0    34. Parthenolide  0    35. Disperse Blue 106  0    36. 2-Bromo-2-Nitropropane-1,3-diol  0     Metals Patch - 12/31/19 1300    Time Antigen Placed  1420     Manufacturer  Other   [employer]   Location  Back    Number of Test  11    Reading Interval  Day 1    Select  Select    Aluminum Hydroxide 10%  0    Chromium chloride 1%  0    Cobalt chloride hexahydrate 1%  0    Molybdenum chloride 0.5%  0    Nickel sulfate hexahydrate 5%  0    Potassium dichromate 0.25%  0    Copper sulfate pentahydrate 2%  0    Tantal 1%  0    Titanium 0.1%  0    Manganese chloride 0.5%  0    Vanadium Pentoxide 10%  0  Assessment and Plan: Tina Richards is a 74 y.o. female with: Allergic contact dermatitis Past history - History of sensitivity to Nickel. Okay with gold and silver. Planning on right knee replacement on 6/15. Interim history - TRUE and metal patch removed today. Borderline positive to nickel. Positive to gold.   Return in about 2 days (around 01/02/2020) for Patch reading.  It was my pleasure to see Tina Richards today and participate in her care. Please feel free to contact me with any questions or concerns.  Sincerely,  Rexene Alberts, DO Allergy & Immunology  [facility] office: [phone] [facility] office: [place] office: [phone]

## 2019-12-31 ENCOUNTER — Other Ambulatory Visit: Payer: Self-pay

## 2019-12-31 ENCOUNTER — Encounter: Payer: Self-pay | Admitting: Allergy

## 2019-12-31 ENCOUNTER — Ambulatory Visit: Payer: Medicare Other | Admitting: Allergy

## 2019-12-31 DIAGNOSIS — L239 Allergic contact dermatitis, unspecified cause: Secondary | ICD-10-CM

## 2019-12-31 NOTE — Assessment & Plan Note (Addendum)
Past history - History of sensitivity to Nickel. Okay with gold and silver. Planning on right knee replacement on 6/15. Interim history - TRUE and metal patch removed today. Borderline positive to nickel. Positive to gold.

## 2020-01-02 ENCOUNTER — Other Ambulatory Visit: Payer: Self-pay

## 2020-01-02 ENCOUNTER — Ambulatory Visit (INDEPENDENT_AMBULATORY_CARE_PROVIDER_SITE_OTHER): Payer: Medicare Other | Admitting: Allergy

## 2020-01-02 ENCOUNTER — Encounter: Payer: Self-pay | Admitting: Allergy

## 2020-01-02 VITALS — Temp 98.3°F

## 2020-01-02 DIAGNOSIS — L230 Allergic contact dermatitis due to metals: Secondary | ICD-10-CM | POA: Diagnosis not present

## 2020-01-02 NOTE — Progress Notes (Signed)
Follow Up Note  RE: Tina Richards MRN: [id number] DOB: 06-13-1946 Date of Office Visit: 01/02/2020  Referring provider: Kathyrn Lass, MD Primary care provider: Kathyrn Lass, MD  History of Present Illness: I had the pleasure of seeing Tina Richards for a follow up visit at the [facility] [place] [facility] [place] on 01/02/2020. She is a 74 y.o. female, who is being followed for dermatitis. Today she is here for final patch test interpretation, given suspected history of contact dermatitis.   Diagnostics:  TRUE TEST and metal test 96 hour reading:  Positive to Nickel and gold.  Borderline to neomycin.   T.R.U.E. Test - 01/02/20 1100    Time Antigen Placed [place] Other   [employer] [place]   Lot # [id number]    Location Back    Number of Test 36    Panel Panel 1;Panel 2;Panel 3    1. Nickel Sulfate 1+ raised erythema   2. Wool Alcohols 0    3. Neomycin Sulfate 1 + erythematous papules   4. Potassium Dichromate 0    5. Caine Mix 0    6. Fragrance Mix 0    7. Colophony 0    8. Paraben Mix 0    9. Negative Control 0    10. Balsam of [place] 0    11. Ethylenediamine Dihydrochloride 0    12. Cobalt Dichloride 0    13. p-tert Butylphenol Formaldehyde Resin 0    14. Epoxy Resin 0    15. Carba Mix 0    16.  Black Rubber Mix 0    17. Cl+ Me-Isothiazolinone 0    18. Quaternium-15 0    19. Methyldibromo Glutaronitrile 0    20. p-Phenylenediamine 0    21. Formaldehyde 0    22. Mercapto Mix 0    23. Thimerosal 0    24. Thiuram Mix 0    25. Diazolidinyl Urea 0    26. Quinoline Mix 0    27. Tixocortol-21-Pivalate 0    28. Gold Sodium Thiosulfate 2+ raised erythema with papules   29. Imidazolidinyl Urea 0    30. Budesonide 0    31. Hydrocortisone-17-Butyrate 0    32. Mercaptobenzothiazole 0    33. Bacitracin 0    34. Parthenolide 0    35. Disperse Blue 106 0    36. 2-Bromo-2-Nitropropane-1,3-diol 0          Metals Patch - 01/02/20 1100    Time Antigen Placed 1420      Manufacturer Other   [employer]   Location Back    Number of Test 11    Select Select    Aluminum Hydroxide 10% 0    Chromium chloride 1% 0    Cobalt chloride hexahydrate 1% 0    Molybdenum chloride 0.5% 0    Nickel sulfate hexahydrate 5% 1+ raised erythema   Potassium dichromate 0.25% 0    Copper sulfate pentahydrate 2% 0    Tantal 1% 0    Titanium 0.1% 0    Manganese chloride 0.5% 0    Vanadium Pentoxide 10% 0            Assessment and Plan: Tina Richards is a 74 y.o. female with:  Contact dermatitis  -Patch testing revealed sensitivities to nickel sulfate, neomycin sulfate and Gold sodium thiosulfate. -Remaining metals as above were negative -We will forward the results to her orthopedist -She has been provided with informational handouts on these chemicals/compounds to be able to  determine if she is being exposed to any of these items which she then should avoid  It was my pleasure to see Tina Richards today and participate in her care. Please feel free to contact me with any questions or concerns.  Sincerely,  Prudy Feeler, MD [facility] [place] [facility] [place] [facility] [place] office: [phone] [facility] office: [place] office: [phone]

## 2020-01-06 DIAGNOSIS — M1711 Unilateral primary osteoarthritis, right knee: Secondary | ICD-10-CM | POA: Diagnosis not present

## 2020-01-08 DIAGNOSIS — M25561 Pain in right knee: Secondary | ICD-10-CM | POA: Diagnosis not present

## 2020-01-08 DIAGNOSIS — M25661 Stiffness of right knee, not elsewhere classified: Secondary | ICD-10-CM | POA: Diagnosis not present

## 2020-02-05 DIAGNOSIS — M25661 Stiffness of right knee, not elsewhere classified: Secondary | ICD-10-CM | POA: Diagnosis not present

## 2020-02-05 DIAGNOSIS — M25561 Pain in right knee: Secondary | ICD-10-CM | POA: Diagnosis not present

## 2020-02-10 DIAGNOSIS — Z96651 Presence of right artificial knee joint: Secondary | ICD-10-CM | POA: Diagnosis not present

## 2020-02-10 DIAGNOSIS — Z471 Aftercare following joint replacement surgery: Secondary | ICD-10-CM | POA: Diagnosis not present

## 2020-02-19 DIAGNOSIS — Z1231 Encounter for screening mammogram for malignant neoplasm of breast: Secondary | ICD-10-CM | POA: Diagnosis not present

## 2020-04-30 DIAGNOSIS — Z23 Encounter for immunization: Secondary | ICD-10-CM | POA: Diagnosis not present

## 2020-05-07 DIAGNOSIS — M25562 Pain in left knee: Secondary | ICD-10-CM | POA: Diagnosis not present

## 2020-05-07 DIAGNOSIS — E559 Vitamin D deficiency, unspecified: Secondary | ICD-10-CM | POA: Diagnosis not present

## 2020-05-07 DIAGNOSIS — E785 Hyperlipidemia, unspecified: Secondary | ICD-10-CM | POA: Diagnosis not present

## 2020-05-07 DIAGNOSIS — M8580 Other specified disorders of bone density and structure, unspecified site: Secondary | ICD-10-CM | POA: Diagnosis not present

## 2020-05-07 DIAGNOSIS — Z79899 Other long term (current) drug therapy: Secondary | ICD-10-CM | POA: Diagnosis not present

## 2020-05-07 DIAGNOSIS — Z0000 Encounter for general adult medical examination without abnormal findings: Secondary | ICD-10-CM | POA: Diagnosis not present

## 2020-05-07 DIAGNOSIS — R202 Paresthesia of skin: Secondary | ICD-10-CM | POA: Diagnosis not present

## 2020-05-07 DIAGNOSIS — E663 Overweight: Secondary | ICD-10-CM | POA: Diagnosis not present

## 2020-05-07 DIAGNOSIS — Z6826 Body mass index (BMI) 26.0-26.9, adult: Secondary | ICD-10-CM | POA: Diagnosis not present

## 2020-08-18 DIAGNOSIS — L821 Other seborrheic keratosis: Secondary | ICD-10-CM | POA: Diagnosis not present

## 2020-08-18 DIAGNOSIS — D225 Melanocytic nevi of trunk: Secondary | ICD-10-CM | POA: Diagnosis not present

## 2020-08-18 DIAGNOSIS — L2089 Other atopic dermatitis: Secondary | ICD-10-CM | POA: Diagnosis not present

## 2020-08-25 DIAGNOSIS — L309 Dermatitis, unspecified: Secondary | ICD-10-CM | POA: Diagnosis not present

## 2020-08-25 DIAGNOSIS — Z6827 Body mass index (BMI) 27.0-27.9, adult: Secondary | ICD-10-CM | POA: Diagnosis not present

## 2020-08-25 DIAGNOSIS — M25572 Pain in left ankle and joints of left foot: Secondary | ICD-10-CM | POA: Diagnosis not present

## 2020-11-04 DIAGNOSIS — Z23 Encounter for immunization: Secondary | ICD-10-CM | POA: Diagnosis not present

## 2020-11-05 DIAGNOSIS — M1712 Unilateral primary osteoarthritis, left knee: Secondary | ICD-10-CM | POA: Diagnosis not present

## 2020-11-05 DIAGNOSIS — Z96651 Presence of right artificial knee joint: Secondary | ICD-10-CM | POA: Diagnosis not present

## 2020-11-25 DIAGNOSIS — H5213 Myopia, bilateral: Secondary | ICD-10-CM | POA: Diagnosis not present

## 2020-11-25 DIAGNOSIS — Z961 Presence of intraocular lens: Secondary | ICD-10-CM | POA: Diagnosis not present

## 2020-11-25 DIAGNOSIS — H43813 Vitreous degeneration, bilateral: Secondary | ICD-10-CM | POA: Diagnosis not present

## 2020-12-06 DIAGNOSIS — Z6827 Body mass index (BMI) 27.0-27.9, adult: Secondary | ICD-10-CM | POA: Diagnosis not present

## 2020-12-06 DIAGNOSIS — R0789 Other chest pain: Secondary | ICD-10-CM | POA: Diagnosis not present

## 2020-12-06 DIAGNOSIS — M1712 Unilateral primary osteoarthritis, left knee: Secondary | ICD-10-CM | POA: Diagnosis not present

## 2020-12-21 DIAGNOSIS — M1712 Unilateral primary osteoarthritis, left knee: Secondary | ICD-10-CM | POA: Diagnosis not present

## 2020-12-23 DIAGNOSIS — M25662 Stiffness of left knee, not elsewhere classified: Secondary | ICD-10-CM | POA: Diagnosis not present

## 2020-12-23 DIAGNOSIS — M25562 Pain in left knee: Secondary | ICD-10-CM | POA: Diagnosis not present

## 2020-12-28 DIAGNOSIS — M25562 Pain in left knee: Secondary | ICD-10-CM | POA: Diagnosis not present

## 2020-12-28 DIAGNOSIS — M25662 Stiffness of left knee, not elsewhere classified: Secondary | ICD-10-CM | POA: Diagnosis not present

## 2020-12-31 DIAGNOSIS — M25562 Pain in left knee: Secondary | ICD-10-CM | POA: Diagnosis not present

## 2020-12-31 DIAGNOSIS — M25662 Stiffness of left knee, not elsewhere classified: Secondary | ICD-10-CM | POA: Diagnosis not present

## 2021-01-03 DIAGNOSIS — M25662 Stiffness of left knee, not elsewhere classified: Secondary | ICD-10-CM | POA: Diagnosis not present

## 2021-01-03 DIAGNOSIS — M25562 Pain in left knee: Secondary | ICD-10-CM | POA: Diagnosis not present

## 2021-01-04 DIAGNOSIS — Z862 Personal history of diseases of the blood and blood-forming organs and certain disorders involving the immune mechanism: Secondary | ICD-10-CM | POA: Diagnosis not present

## 2021-01-06 DIAGNOSIS — M25662 Stiffness of left knee, not elsewhere classified: Secondary | ICD-10-CM | POA: Diagnosis not present

## 2021-01-06 DIAGNOSIS — M25562 Pain in left knee: Secondary | ICD-10-CM | POA: Diagnosis not present

## 2021-01-11 DIAGNOSIS — M25562 Pain in left knee: Secondary | ICD-10-CM | POA: Diagnosis not present

## 2021-01-11 DIAGNOSIS — M25662 Stiffness of left knee, not elsewhere classified: Secondary | ICD-10-CM | POA: Diagnosis not present

## 2021-01-13 DIAGNOSIS — M25662 Stiffness of left knee, not elsewhere classified: Secondary | ICD-10-CM | POA: Diagnosis not present

## 2021-01-13 DIAGNOSIS — M25562 Pain in left knee: Secondary | ICD-10-CM | POA: Diagnosis not present

## 2021-01-18 DIAGNOSIS — M25662 Stiffness of left knee, not elsewhere classified: Secondary | ICD-10-CM | POA: Diagnosis not present

## 2021-01-18 DIAGNOSIS — M25562 Pain in left knee: Secondary | ICD-10-CM | POA: Diagnosis not present

## 2021-01-25 DIAGNOSIS — Z96653 Presence of artificial knee joint, bilateral: Secondary | ICD-10-CM | POA: Diagnosis not present

## 2021-01-25 DIAGNOSIS — Z471 Aftercare following joint replacement surgery: Secondary | ICD-10-CM | POA: Diagnosis not present

## 2021-02-08 DIAGNOSIS — D649 Anemia, unspecified: Secondary | ICD-10-CM | POA: Diagnosis not present

## 2021-02-24 DIAGNOSIS — U071 COVID-19: Secondary | ICD-10-CM | POA: Diagnosis not present

## 2021-02-24 DIAGNOSIS — R051 Acute cough: Secondary | ICD-10-CM | POA: Diagnosis not present

## 2021-02-24 DIAGNOSIS — J00 Acute nasopharyngitis [common cold]: Secondary | ICD-10-CM | POA: Diagnosis not present

## 2021-03-17 DIAGNOSIS — Z1231 Encounter for screening mammogram for malignant neoplasm of breast: Secondary | ICD-10-CM | POA: Diagnosis not present

## 2021-03-17 DIAGNOSIS — Z780 Asymptomatic menopausal state: Secondary | ICD-10-CM | POA: Diagnosis not present

## 2021-03-17 DIAGNOSIS — M8589 Other specified disorders of bone density and structure, multiple sites: Secondary | ICD-10-CM | POA: Diagnosis not present

## 2021-03-25 DIAGNOSIS — R928 Other abnormal and inconclusive findings on diagnostic imaging of breast: Secondary | ICD-10-CM | POA: Diagnosis not present

## 2021-03-25 DIAGNOSIS — R922 Inconclusive mammogram: Secondary | ICD-10-CM | POA: Diagnosis not present

## 2021-04-07 DIAGNOSIS — N6342 Unspecified lump in left breast, subareolar: Secondary | ICD-10-CM | POA: Diagnosis not present

## 2021-04-07 DIAGNOSIS — N6012 Diffuse cystic mastopathy of left breast: Secondary | ICD-10-CM | POA: Diagnosis not present

## 2021-05-12 DIAGNOSIS — Z23 Encounter for immunization: Secondary | ICD-10-CM | POA: Diagnosis not present

## 2021-05-17 DIAGNOSIS — K219 Gastro-esophageal reflux disease without esophagitis: Secondary | ICD-10-CM | POA: Diagnosis not present

## 2021-05-17 DIAGNOSIS — E785 Hyperlipidemia, unspecified: Secondary | ICD-10-CM | POA: Diagnosis not present

## 2021-05-17 DIAGNOSIS — M8580 Other specified disorders of bone density and structure, unspecified site: Secondary | ICD-10-CM | POA: Diagnosis not present

## 2021-05-17 DIAGNOSIS — Z1389 Encounter for screening for other disorder: Secondary | ICD-10-CM | POA: Diagnosis not present

## 2021-05-17 DIAGNOSIS — Z23 Encounter for immunization: Secondary | ICD-10-CM | POA: Diagnosis not present

## 2021-05-17 DIAGNOSIS — E663 Overweight: Secondary | ICD-10-CM | POA: Diagnosis not present

## 2021-05-17 DIAGNOSIS — M85851 Other specified disorders of bone density and structure, right thigh: Secondary | ICD-10-CM | POA: Diagnosis not present

## 2021-05-17 DIAGNOSIS — Z79899 Other long term (current) drug therapy: Secondary | ICD-10-CM | POA: Diagnosis not present

## 2021-05-17 DIAGNOSIS — Z0000 Encounter for general adult medical examination without abnormal findings: Secondary | ICD-10-CM | POA: Diagnosis not present

## 2021-05-17 DIAGNOSIS — D649 Anemia, unspecified: Secondary | ICD-10-CM | POA: Diagnosis not present

## 2021-05-24 ENCOUNTER — Other Ambulatory Visit: Payer: Self-pay | Admitting: Family Medicine

## 2021-05-24 DIAGNOSIS — E785 Hyperlipidemia, unspecified: Secondary | ICD-10-CM

## 2021-06-10 ENCOUNTER — Ambulatory Visit
Admission: RE | Admit: 2021-06-10 | Discharge: 2021-06-10 | Disposition: A | Discharge status: Home / Self Care | Payer: No Typology Code available for payment source | Source: Ambulatory Visit | Attending: Family Medicine | Admitting: Family Medicine

## 2021-06-10 DIAGNOSIS — E785 Hyperlipidemia, unspecified: Secondary | ICD-10-CM

## 2021-07-05 DIAGNOSIS — N760 Acute vaginitis: Secondary | ICD-10-CM | POA: Diagnosis not present

## 2021-07-05 DIAGNOSIS — N898 Other specified noninflammatory disorders of vagina: Secondary | ICD-10-CM | POA: Diagnosis not present

## 2021-07-15 DIAGNOSIS — N950 Postmenopausal bleeding: Secondary | ICD-10-CM | POA: Diagnosis not present

## 2021-07-21 DIAGNOSIS — N858 Other specified noninflammatory disorders of uterus: Secondary | ICD-10-CM | POA: Diagnosis not present

## 2021-07-21 DIAGNOSIS — N950 Postmenopausal bleeding: Secondary | ICD-10-CM | POA: Diagnosis not present

## 2021-08-23 DIAGNOSIS — L821 Other seborrheic keratosis: Secondary | ICD-10-CM | POA: Diagnosis not present

## 2021-08-23 DIAGNOSIS — D225 Melanocytic nevi of trunk: Secondary | ICD-10-CM | POA: Diagnosis not present

## 2021-08-23 DIAGNOSIS — R208 Other disturbances of skin sensation: Secondary | ICD-10-CM | POA: Diagnosis not present

## 2021-11-22 DIAGNOSIS — H26493 Other secondary cataract, bilateral: Secondary | ICD-10-CM | POA: Diagnosis not present

## 2021-11-22 DIAGNOSIS — Z961 Presence of intraocular lens: Secondary | ICD-10-CM | POA: Diagnosis not present

## 2021-11-22 DIAGNOSIS — H5212 Myopia, left eye: Secondary | ICD-10-CM | POA: Diagnosis not present

## 2021-11-22 DIAGNOSIS — H43813 Vitreous degeneration, bilateral: Secondary | ICD-10-CM | POA: Diagnosis not present

## 2021-12-05 DIAGNOSIS — K219 Gastro-esophageal reflux disease without esophagitis: Secondary | ICD-10-CM | POA: Diagnosis not present

## 2021-12-05 DIAGNOSIS — R079 Chest pain, unspecified: Secondary | ICD-10-CM | POA: Diagnosis not present

## 2021-12-05 DIAGNOSIS — L989 Disorder of the skin and subcutaneous tissue, unspecified: Secondary | ICD-10-CM | POA: Diagnosis not present

## 2021-12-15 DIAGNOSIS — Z23 Encounter for immunization: Secondary | ICD-10-CM | POA: Diagnosis not present

## 2022-01-10 DIAGNOSIS — Z471 Aftercare following joint replacement surgery: Secondary | ICD-10-CM | POA: Diagnosis not present

## 2022-01-10 DIAGNOSIS — Z96652 Presence of left artificial knee joint: Secondary | ICD-10-CM | POA: Diagnosis not present

## 2022-01-25 DIAGNOSIS — Z124 Encounter for screening for malignant neoplasm of cervix: Secondary | ICD-10-CM | POA: Diagnosis not present

## 2022-01-25 DIAGNOSIS — Z01411 Encounter for gynecological examination (general) (routine) with abnormal findings: Secondary | ICD-10-CM | POA: Diagnosis not present

## 2022-01-25 DIAGNOSIS — Z01419 Encounter for gynecological examination (general) (routine) without abnormal findings: Secondary | ICD-10-CM | POA: Diagnosis not present

## 2022-01-25 DIAGNOSIS — Z0142 Encounter for cervical smear to confirm findings of recent normal smear following initial abnormal smear: Secondary | ICD-10-CM | POA: Diagnosis not present

## 2022-03-23 DIAGNOSIS — Z1231 Encounter for screening mammogram for malignant neoplasm of breast: Secondary | ICD-10-CM | POA: Diagnosis not present

## 2022-04-28 DIAGNOSIS — Z23 Encounter for immunization: Secondary | ICD-10-CM | POA: Diagnosis not present

## 2022-08-08 DIAGNOSIS — E559 Vitamin D deficiency, unspecified: Secondary | ICD-10-CM | POA: Diagnosis not present

## 2022-08-08 DIAGNOSIS — H6123 Impacted cerumen, bilateral: Secondary | ICD-10-CM | POA: Diagnosis not present

## 2022-08-08 DIAGNOSIS — M8580 Other specified disorders of bone density and structure, unspecified site: Secondary | ICD-10-CM | POA: Diagnosis not present

## 2022-08-08 DIAGNOSIS — Z862 Personal history of diseases of the blood and blood-forming organs and certain disorders involving the immune mechanism: Secondary | ICD-10-CM | POA: Diagnosis not present

## 2022-08-08 DIAGNOSIS — Z6827 Body mass index (BMI) 27.0-27.9, adult: Secondary | ICD-10-CM | POA: Diagnosis not present

## 2022-08-08 DIAGNOSIS — Z131 Encounter for screening for diabetes mellitus: Secondary | ICD-10-CM | POA: Diagnosis not present

## 2022-08-08 DIAGNOSIS — K219 Gastro-esophageal reflux disease without esophagitis: Secondary | ICD-10-CM | POA: Diagnosis not present

## 2022-08-08 DIAGNOSIS — Z0000 Encounter for general adult medical examination without abnormal findings: Secondary | ICD-10-CM | POA: Diagnosis not present

## 2022-08-10 ENCOUNTER — Encounter: Payer: Self-pay | Admitting: Physician Assistant

## 2022-08-10 NOTE — Progress Notes (Addendum)
Error

## 2022-08-15 DIAGNOSIS — H6121 Impacted cerumen, right ear: Secondary | ICD-10-CM | POA: Diagnosis not present

## 2022-08-23 DIAGNOSIS — D225 Melanocytic nevi of trunk: Secondary | ICD-10-CM | POA: Diagnosis not present

## 2022-08-23 DIAGNOSIS — L821 Other seborrheic keratosis: Secondary | ICD-10-CM | POA: Diagnosis not present

## 2022-08-23 DIAGNOSIS — L570 Actinic keratosis: Secondary | ICD-10-CM | POA: Diagnosis not present

## 2022-08-23 DIAGNOSIS — L309 Dermatitis, unspecified: Secondary | ICD-10-CM | POA: Diagnosis not present

## 2022-08-23 DIAGNOSIS — D224 Melanocytic nevi of scalp and neck: Secondary | ICD-10-CM | POA: Diagnosis not present

## 2022-08-29 DIAGNOSIS — M79671 Pain in right foot: Secondary | ICD-10-CM | POA: Diagnosis not present

## 2022-11-28 DIAGNOSIS — H43813 Vitreous degeneration, bilateral: Secondary | ICD-10-CM | POA: Diagnosis not present

## 2022-11-28 DIAGNOSIS — Z961 Presence of intraocular lens: Secondary | ICD-10-CM | POA: Diagnosis not present

## 2022-11-28 DIAGNOSIS — H5212 Myopia, left eye: Secondary | ICD-10-CM | POA: Diagnosis not present

## 2023-04-03 DIAGNOSIS — Z1231 Encounter for screening mammogram for malignant neoplasm of breast: Secondary | ICD-10-CM | POA: Diagnosis not present

## 2023-04-17 DIAGNOSIS — Z23 Encounter for immunization: Secondary | ICD-10-CM | POA: Diagnosis not present

## 2023-04-25 DIAGNOSIS — M25562 Pain in left knee: Secondary | ICD-10-CM | POA: Diagnosis not present

## 2023-05-15 DIAGNOSIS — Z23 Encounter for immunization: Secondary | ICD-10-CM | POA: Diagnosis not present

## 2023-08-16 DIAGNOSIS — K219 Gastro-esophageal reflux disease without esophagitis: Secondary | ICD-10-CM | POA: Diagnosis not present

## 2023-08-16 DIAGNOSIS — Z0000 Encounter for general adult medical examination without abnormal findings: Secondary | ICD-10-CM | POA: Diagnosis not present

## 2023-08-16 DIAGNOSIS — M8580 Other specified disorders of bone density and structure, unspecified site: Secondary | ICD-10-CM | POA: Diagnosis not present

## 2023-08-16 DIAGNOSIS — Z96653 Presence of artificial knee joint, bilateral: Secondary | ICD-10-CM | POA: Diagnosis not present

## 2023-08-16 DIAGNOSIS — Z1331 Encounter for screening for depression: Secondary | ICD-10-CM | POA: Diagnosis not present

## 2023-08-16 DIAGNOSIS — Z6827 Body mass index (BMI) 27.0-27.9, adult: Secondary | ICD-10-CM | POA: Diagnosis not present

## 2023-08-16 DIAGNOSIS — G720 Drug-induced myopathy: Secondary | ICD-10-CM | POA: Diagnosis not present

## 2023-08-16 DIAGNOSIS — E785 Hyperlipidemia, unspecified: Secondary | ICD-10-CM | POA: Diagnosis not present

## 2023-08-16 DIAGNOSIS — E663 Overweight: Secondary | ICD-10-CM | POA: Diagnosis not present

## 2023-08-16 DIAGNOSIS — Z862 Personal history of diseases of the blood and blood-forming organs and certain disorders involving the immune mechanism: Secondary | ICD-10-CM | POA: Diagnosis not present

## 2023-08-16 DIAGNOSIS — Z131 Encounter for screening for diabetes mellitus: Secondary | ICD-10-CM | POA: Diagnosis not present

## 2023-08-29 DIAGNOSIS — L821 Other seborrheic keratosis: Secondary | ICD-10-CM | POA: Diagnosis not present

## 2023-08-29 DIAGNOSIS — L820 Inflamed seborrheic keratosis: Secondary | ICD-10-CM | POA: Diagnosis not present

## 2023-08-29 DIAGNOSIS — D485 Neoplasm of uncertain behavior of skin: Secondary | ICD-10-CM | POA: Diagnosis not present

## 2023-08-29 DIAGNOSIS — D225 Melanocytic nevi of trunk: Secondary | ICD-10-CM | POA: Diagnosis not present

## 2023-08-29 DIAGNOSIS — L570 Actinic keratosis: Secondary | ICD-10-CM | POA: Diagnosis not present

## 2023-10-22 DIAGNOSIS — E785 Hyperlipidemia, unspecified: Secondary | ICD-10-CM | POA: Diagnosis not present

## 2023-10-22 DIAGNOSIS — E663 Overweight: Secondary | ICD-10-CM | POA: Diagnosis not present

## 2023-11-21 DIAGNOSIS — E785 Hyperlipidemia, unspecified: Secondary | ICD-10-CM | POA: Diagnosis not present

## 2023-11-21 DIAGNOSIS — E663 Overweight: Secondary | ICD-10-CM | POA: Diagnosis not present

## 2023-12-04 DIAGNOSIS — H43813 Vitreous degeneration, bilateral: Secondary | ICD-10-CM | POA: Diagnosis not present

## 2023-12-04 DIAGNOSIS — H524 Presbyopia: Secondary | ICD-10-CM | POA: Diagnosis not present

## 2023-12-04 DIAGNOSIS — D23111 Other benign neoplasm of skin of right upper eyelid, including canthus: Secondary | ICD-10-CM | POA: Diagnosis not present

## 2023-12-04 DIAGNOSIS — Z961 Presence of intraocular lens: Secondary | ICD-10-CM | POA: Diagnosis not present

## 2024-02-08 DIAGNOSIS — Z01411 Encounter for gynecological examination (general) (routine) with abnormal findings: Secondary | ICD-10-CM | POA: Diagnosis not present

## 2024-02-08 DIAGNOSIS — Z1331 Encounter for screening for depression: Secondary | ICD-10-CM | POA: Diagnosis not present

## 2024-02-08 DIAGNOSIS — Z124 Encounter for screening for malignant neoplasm of cervix: Secondary | ICD-10-CM | POA: Diagnosis not present

## 2024-02-08 DIAGNOSIS — Z01419 Encounter for gynecological examination (general) (routine) without abnormal findings: Secondary | ICD-10-CM | POA: Diagnosis not present

## 2024-03-17 DIAGNOSIS — D252 Subserosal leiomyoma of uterus: Secondary | ICD-10-CM | POA: Diagnosis not present

## 2024-03-17 DIAGNOSIS — D251 Intramural leiomyoma of uterus: Secondary | ICD-10-CM | POA: Diagnosis not present

## 2024-03-17 DIAGNOSIS — Z8049 Family history of malignant neoplasm of other genital organs: Secondary | ICD-10-CM | POA: Diagnosis not present

## 2024-04-08 DIAGNOSIS — Z1231 Encounter for screening mammogram for malignant neoplasm of breast: Secondary | ICD-10-CM | POA: Diagnosis not present

## 2024-04-08 DIAGNOSIS — M8589 Other specified disorders of bone density and structure, multiple sites: Secondary | ICD-10-CM | POA: Diagnosis not present

## 2024-04-22 DIAGNOSIS — H10501 Unspecified blepharoconjunctivitis, right eye: Secondary | ICD-10-CM | POA: Diagnosis not present

## 2024-04-22 DIAGNOSIS — H2000 Unspecified acute and subacute iridocyclitis: Secondary | ICD-10-CM | POA: Diagnosis not present

## 2024-04-29 DIAGNOSIS — H01002 Unspecified blepharitis right lower eyelid: Secondary | ICD-10-CM | POA: Diagnosis not present

## 2024-04-29 DIAGNOSIS — H10501 Unspecified blepharoconjunctivitis, right eye: Secondary | ICD-10-CM | POA: Diagnosis not present

## 2024-05-06 DIAGNOSIS — Z23 Encounter for immunization: Secondary | ICD-10-CM | POA: Diagnosis not present
